# Patient Record
Sex: Female | Born: 1935 | Race: White | Hispanic: No | Smoking: Former smoker
Health system: Western US, Academic
[De-identification: ages and names within clinical notes are randomized; demographics above are authoritative.]

## PROBLEM LIST (undated history)

## (undated) DIAGNOSIS — I639 Cerebral infarction, unspecified: Secondary | ICD-10-CM

## (undated) HISTORY — DX: Cerebral infarction, unspecified: I63.9

---

## 2004-07-28 ENCOUNTER — Ambulatory Visit: Payer: Self-pay | Admitting: Family Medicine

## 2005-05-25 ENCOUNTER — Ambulatory Visit: Payer: Self-pay | Admitting: Rheumatology

## 2005-07-04 ENCOUNTER — Ambulatory Visit: Payer: Self-pay | Admitting: Rheumatology

## 2005-07-05 ENCOUNTER — Ambulatory Visit: Payer: Self-pay | Admitting: Obstetrics and Gynecology

## 2005-07-13 ENCOUNTER — Ambulatory Visit: Payer: Self-pay | Admitting: Obstetrics and Gynecology

## 2005-09-27 ENCOUNTER — Ambulatory Visit: Payer: Self-pay | Admitting: Family Medicine

## 2005-11-09 ENCOUNTER — Ambulatory Visit: Payer: Self-pay | Admitting: Family Medicine

## 2005-11-21 ENCOUNTER — Ambulatory Visit: Payer: Self-pay | Admitting: Family Medicine

## 2006-01-18 ENCOUNTER — Inpatient Hospital Stay: Payer: Self-pay | Admitting: Rheumatology

## 2006-03-22 ENCOUNTER — Ambulatory Visit: Payer: Self-pay | Admitting: Internal Medicine

## 2006-07-10 ENCOUNTER — Ambulatory Visit: Payer: Self-pay | Admitting: Obstetrics and Gynecology

## 2006-07-13 ENCOUNTER — Ambulatory Visit: Payer: Self-pay | Admitting: Specialist

## 2006-09-27 ENCOUNTER — Ambulatory Visit: Payer: Self-pay | Admitting: Gastroenterology

## 2006-10-21 IMAGING — CR DG CHEST 1V PORT
1 series · 1 of 1 positions shown · non-contrast
Comparison: none

REASON FOR EXAM: Post PICC insertion
COMMENTS:

[view not recorded]
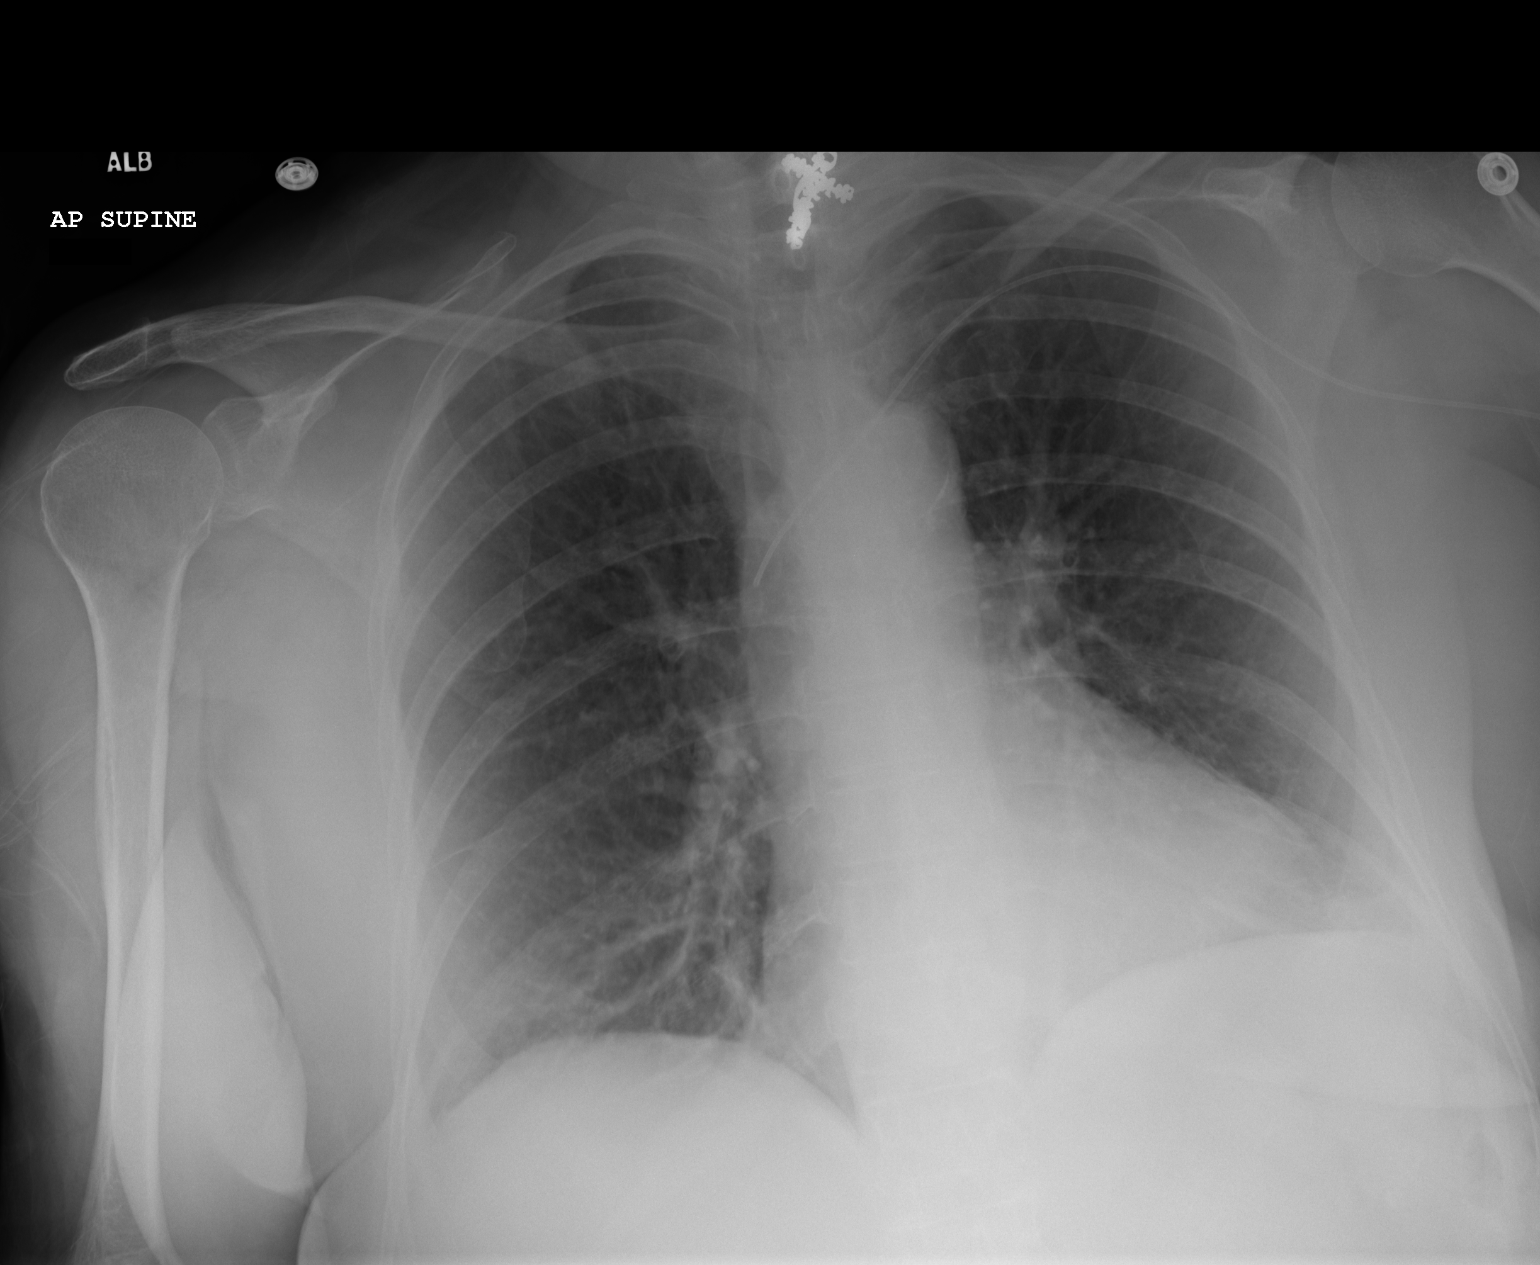

[1 of 1 positions shown; findings below may reference images not displayed]

PROCEDURE:     DXR - DXR PORTABLE CHEST SINGLE VIEW  - January 23, 2006  [DATE]

RESULT:        A LEFT sided central venous catheter is in place with lead
tip projecting in the region of the brachiocephalic/superior vena caval
junction.  There is no radiographic evidence of pneumothorax.  The cardiac
silhouette is enlarged.  The visualized bony skeleton appears grossly
unremarkable.
IMPRESSION: Central venous catheter as described above.

## 2006-12-26 ENCOUNTER — Ambulatory Visit: Payer: Self-pay | Admitting: Specialist

## 2007-02-12 ENCOUNTER — Emergency Department: Payer: Self-pay | Admitting: Emergency Medicine

## 2007-02-12 ENCOUNTER — Other Ambulatory Visit: Payer: Self-pay

## 2007-06-15 ENCOUNTER — Ambulatory Visit: Payer: Self-pay | Admitting: Internal Medicine

## 2007-06-28 ENCOUNTER — Ambulatory Visit: Payer: Self-pay | Admitting: Family Medicine

## 2007-07-16 ENCOUNTER — Ambulatory Visit: Payer: Self-pay | Admitting: Obstetrics and Gynecology

## 2007-12-04 ENCOUNTER — Other Ambulatory Visit: Payer: Self-pay

## 2007-12-04 ENCOUNTER — Inpatient Hospital Stay: Payer: Self-pay | Admitting: Specialist

## 2007-12-14 ENCOUNTER — Ambulatory Visit: Payer: Self-pay | Admitting: Family Medicine

## 2008-07-21 ENCOUNTER — Ambulatory Visit: Payer: Self-pay | Admitting: Internal Medicine

## 2008-07-31 ENCOUNTER — Ambulatory Visit: Payer: Self-pay | Admitting: Internal Medicine

## 2009-01-16 ENCOUNTER — Ambulatory Visit: Payer: Self-pay | Admitting: Internal Medicine

## 2009-07-30 ENCOUNTER — Ambulatory Visit: Payer: Self-pay | Admitting: Internal Medicine

## 2009-07-30 ENCOUNTER — Ambulatory Visit: Payer: Self-pay | Admitting: Specialist

## 2009-07-31 ENCOUNTER — Ambulatory Visit: Payer: Self-pay | Admitting: Internal Medicine

## 2009-08-04 ENCOUNTER — Ambulatory Visit: Payer: Self-pay | Admitting: Internal Medicine

## 2009-09-24 ENCOUNTER — Ambulatory Visit: Payer: Self-pay | Admitting: Gastroenterology

## 2010-01-19 ENCOUNTER — Ambulatory Visit: Payer: Self-pay | Admitting: Specialist

## 2010-06-28 ENCOUNTER — Ambulatory Visit: Payer: Self-pay | Admitting: Cardiology

## 2010-08-16 ENCOUNTER — Ambulatory Visit: Payer: Self-pay | Admitting: Internal Medicine

## 2010-10-15 ENCOUNTER — Ambulatory Visit: Payer: Self-pay | Admitting: Internal Medicine

## 2010-10-17 ENCOUNTER — Ambulatory Visit: Payer: Self-pay | Admitting: Family Medicine

## 2010-10-18 ENCOUNTER — Other Ambulatory Visit: Payer: Self-pay | Admitting: Rheumatology

## 2010-11-04 ENCOUNTER — Ambulatory Visit: Payer: Self-pay | Admitting: Obstetrics and Gynecology

## 2011-05-20 ENCOUNTER — Ambulatory Visit: Payer: Self-pay | Admitting: Internal Medicine

## 2011-05-26 ENCOUNTER — Ambulatory Visit: Payer: Self-pay | Admitting: Internal Medicine

## 2011-05-31 ENCOUNTER — Ambulatory Visit: Payer: Self-pay | Admitting: Internal Medicine

## 2011-07-26 ENCOUNTER — Ambulatory Visit: Payer: Self-pay | Admitting: Oncology

## 2011-11-08 ENCOUNTER — Ambulatory Visit: Payer: Self-pay | Admitting: Specialist

## 2012-02-21 IMAGING — CT CT ABD-PELV W/ CM
1 of 3 series · 13 of 32 positions shown, 18 images · non-contrast
Comparison: none

REASON FOR EXAM: Right lower extremitiy edema hx of vulvar ca eval for
lymphadenopathy  or a mass
COMMENTS:

[Series 2: soft tissue · axial · 0.74mm/px · z∈[-66,+284]mm · 13 of 80 slices shown, 18 images]
[im 5/80  soft-tissue]
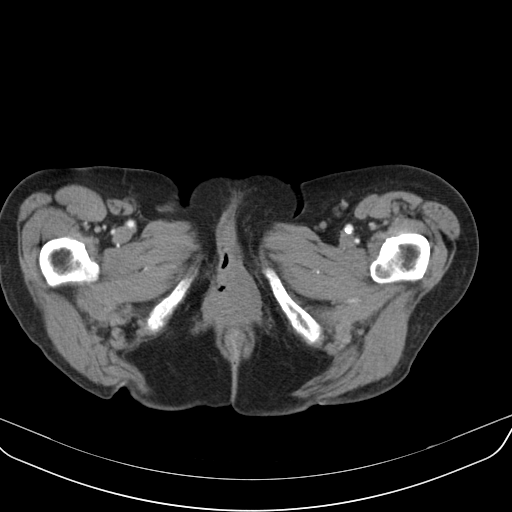
[im 5/80  bone]
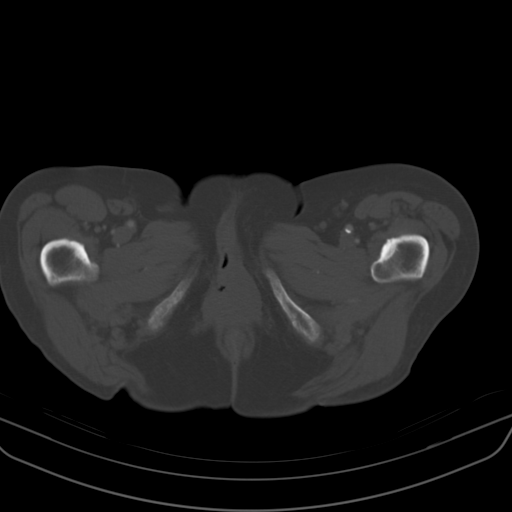
[im 14/80  soft-tissue]
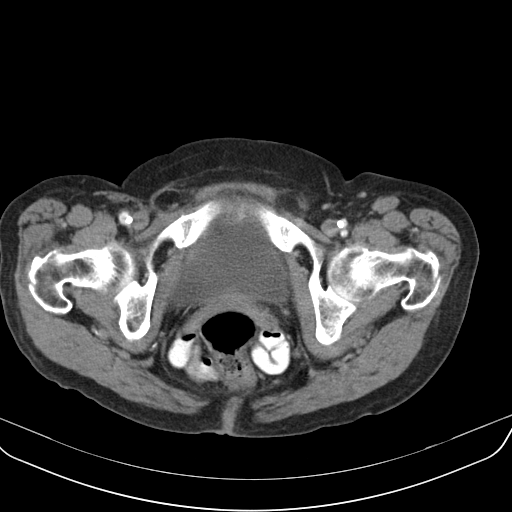
[im 19/80  soft-tissue]
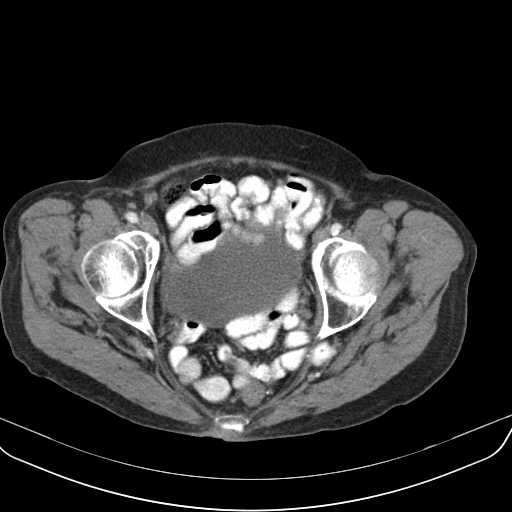
[im 24/80  soft-tissue]
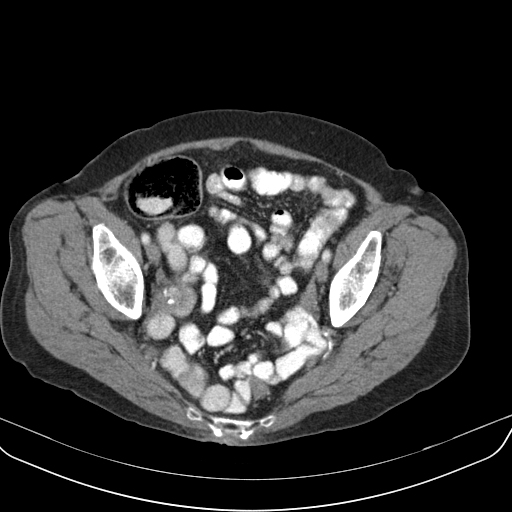
[im 33/80  soft-tissue]
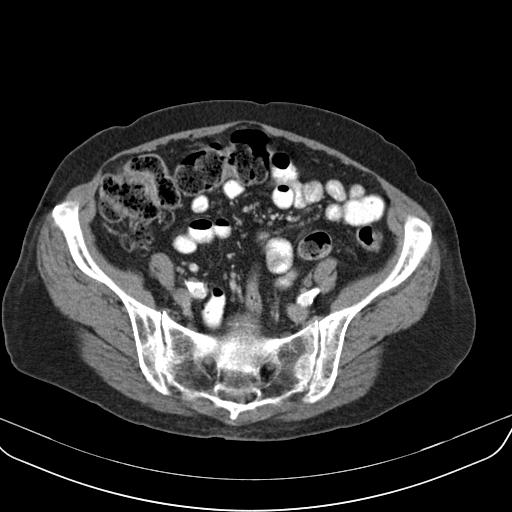
[im 38/80  soft-tissue]
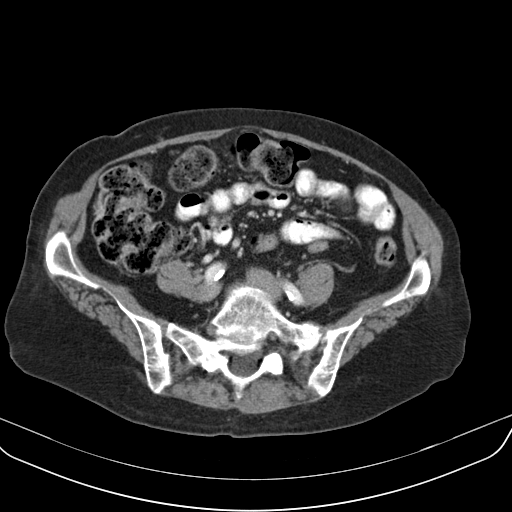
[im 42/80  soft-tissue]
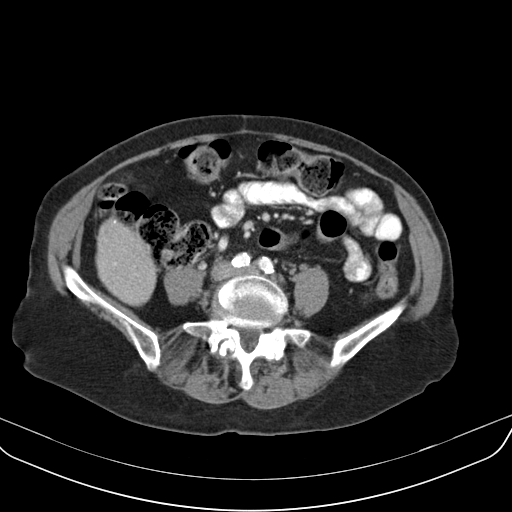
[im 52/80  soft-tissue]
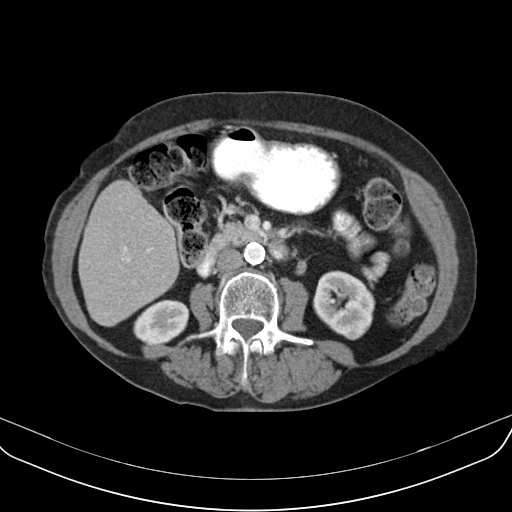
[im 56/80  soft-tissue]
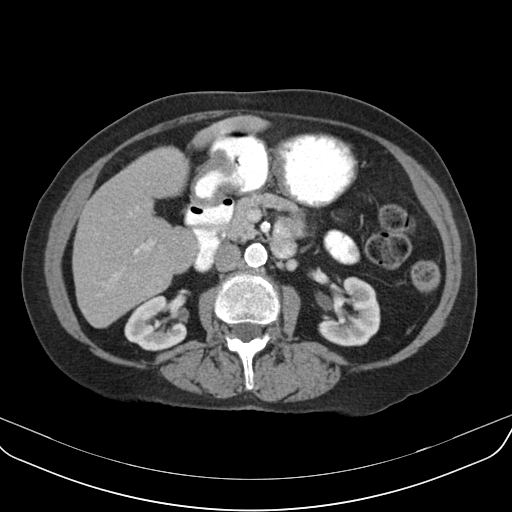
[im 56/80  bone]
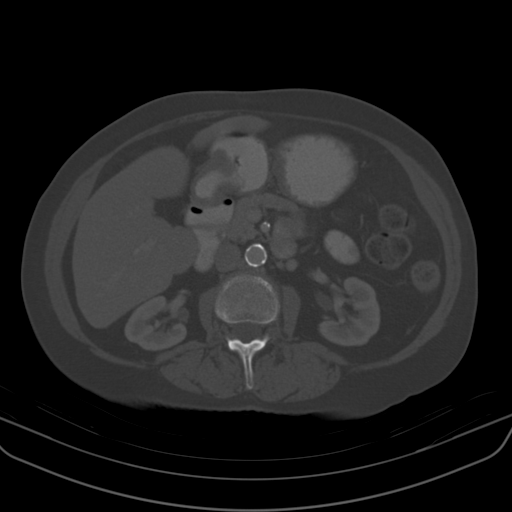
[im 61/80  soft-tissue]
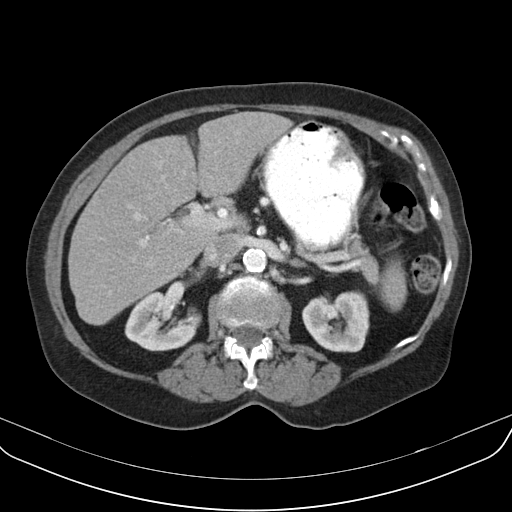
[im 61/80  lung]
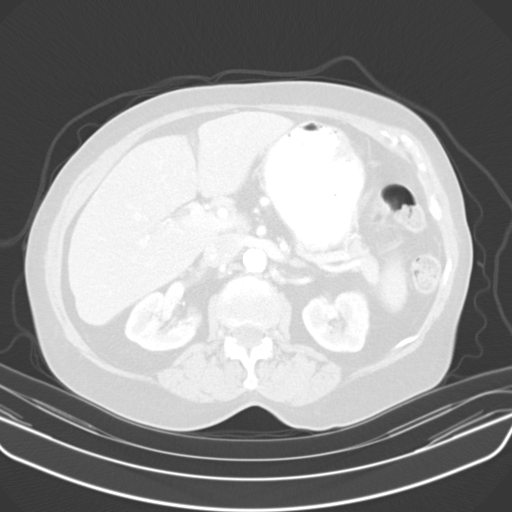
[im 66/80  lung]
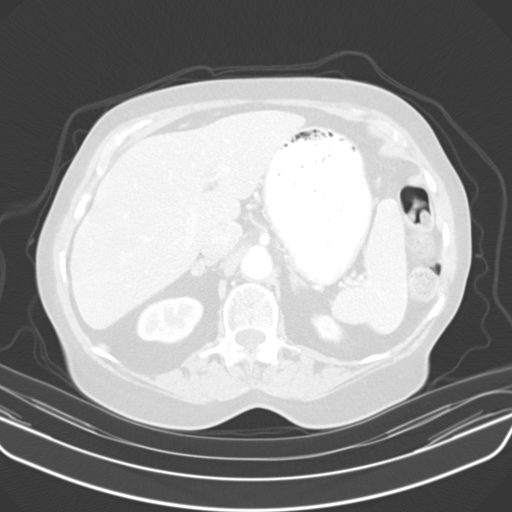
[im 70/80  soft-tissue]
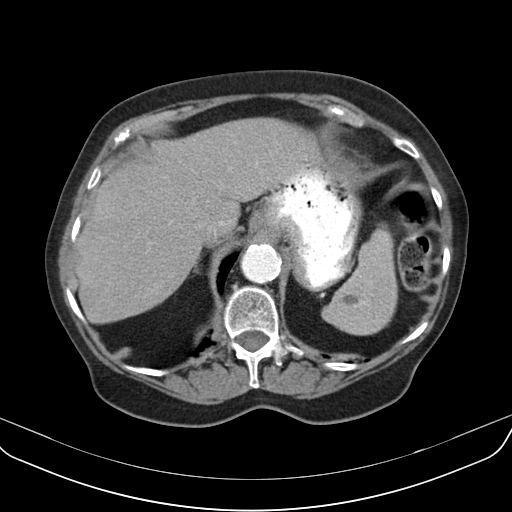
[im 70/80  lung]
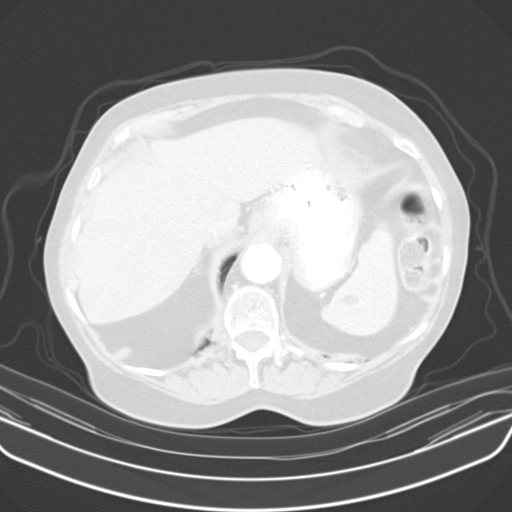
[im 75/80  soft-tissue]
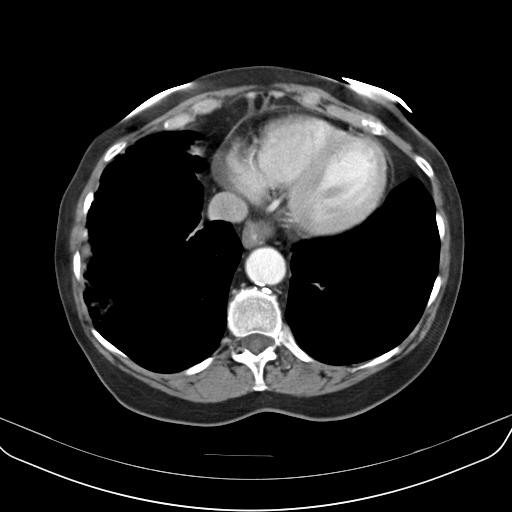
[im 75/80  lung]
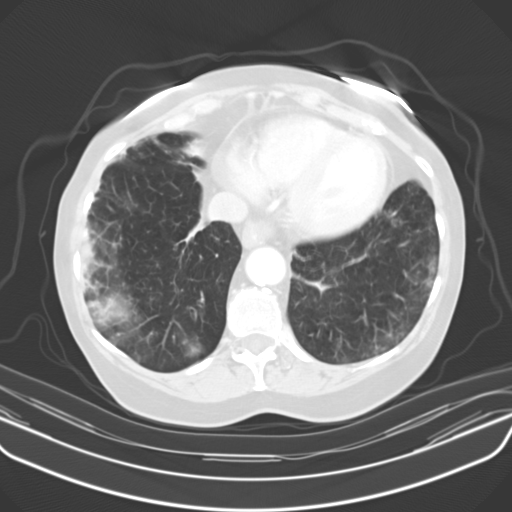

[13 of 32 positions shown; findings below may reference images not displayed]

PROCEDURE:     NITA - NITA ABDOMEN / PELVIS W  - May 26, 2011 [DATE]

RESULT:     Axial CT scanning was performed through the abdomen and pelvis
at 5 mm intervals and slice thicknesses following intravenous and
demonstration of 85 cc of Isovue 300. The patient also received oral
contrast material. Review of multiplanar reconstructed images was performed
separately on the VIA monitor.

The lung bases demonstrate minimal atelectatic versus fibrotic changes in
the lower lobes. The liver exhibits no focal mass nor ductal dilation. The
gallbladder is surgically absent. There is a moderate sized hiatal hernia.
The stomach is otherwise normal in appearance. The spleen is normal in size
and contains punctate calcifications consistent with prior granulomatous
infection. Hypodensities within the spleen likely reflect hemangiomas or
cysts. The pancreas exhibits no focal mass nor ductal dilation. There is
minimal prominence of the lateral limb of the right adrenal gland. The
kidneys enhance well with no evidence of obstruction. The caliber of the
abdominal aorta is normal.

The orally administered contrast has traversed much of the small bowel.
There is no evidence of small bowel obstruction or ileus. Contrast has not
yet reached colon. The stool and gas pattern within the colon is normal in
appearance. Within the pelvis the urinary bladder is partially distended and
grossly normal. There are structures in the adnexal regions which likely
reflect cysts. There are calcifications associated with both adnexal
structures. There is no free fluid within the pelvis.

The inferior vena cava and the common and external iliac veins appear normal
in caliber. No inguinal lymphadenopathy nor other mass effect upon the
venous structures is seen. There is a small right inguinal hernia containing
fat.

On delayed images contrast within the renal collecting system is normal in
appearance area the lumbar vertebral bodies are preserved in height.
IMPRESSION: 1. I do not see evidence of intra-abdominal nor pelvic nor inguinal
lymphadenopathy.
2. I see no hepatic masses. The gallbladder is surgically absent.
3. There are likely tiny cysts or hemangiomas within the spleen. There is a
minimal degree of enlargement of the right adrenal gland which is stable
since a study 19 January, 2010.
3. Within the adnexal regions normal sized complex-appearing structures are
present containing low density areas as well as calcifications. Further
evaluation with pelvic ultrasound may be useful.

## 2012-04-19 ENCOUNTER — Emergency Department: Payer: Self-pay | Admitting: Emergency Medicine

## 2012-04-20 ENCOUNTER — Observation Stay: Payer: Self-pay | Admitting: Internal Medicine

## 2012-04-20 ENCOUNTER — Ambulatory Visit: Payer: Self-pay | Admitting: Orthopaedic Surgery

## 2012-04-20 LAB — CBC
HCT: 37.7 % (ref 35.0–47.0)
MCH: 30.7 pg (ref 26.0–34.0)
MCHC: 34.4 g/dL (ref 32.0–36.0)
MCV: 89 fL (ref 80–100)
RBC: 4.22 10*6/uL (ref 3.80–5.20)
RDW: 14.8 % — ABNORMAL HIGH (ref 11.5–14.5)
WBC: 13.6 10*3/uL — ABNORMAL HIGH (ref 3.6–11.0)

## 2012-04-20 LAB — URINALYSIS, COMPLETE
Bacteria: NONE SEEN
Bilirubin,UR: NEGATIVE
Blood: NEGATIVE
Glucose,UR: NEGATIVE mg/dL (ref 0–75)
Leukocyte Esterase: NEGATIVE
Nitrite: NEGATIVE
Ph: 7 (ref 4.5–8.0)
Protein: NEGATIVE
Squamous Epithelial: NONE SEEN
WBC UR: 11 /HPF (ref 0–5)

## 2012-04-20 LAB — BASIC METABOLIC PANEL
Anion Gap: 10 (ref 7–16)
BUN: 22 mg/dL — ABNORMAL HIGH (ref 7–18)
Chloride: 96 mmol/L — ABNORMAL LOW (ref 98–107)
Creatinine: 1.11 mg/dL (ref 0.60–1.30)
EGFR (African American): 56 — ABNORMAL LOW
EGFR (Non-African Amer.): 49 — ABNORMAL LOW
Glucose: 83 mg/dL (ref 65–99)
Osmolality: 267 (ref 275–301)
Potassium: 3.5 mmol/L (ref 3.5–5.1)
Sodium: 132 mmol/L — ABNORMAL LOW (ref 136–145)

## 2012-04-21 LAB — BASIC METABOLIC PANEL
Chloride: 96 mmol/L — ABNORMAL LOW (ref 98–107)
Co2: 26 mmol/L (ref 21–32)
Creatinine: 0.84 mg/dL (ref 0.60–1.30)
EGFR (African American): >60
Glucose: 75 mg/dL (ref 65–99)
Potassium: 3.7 mmol/L (ref 3.5–5.1)
Sodium: 132 mmol/L — ABNORMAL LOW (ref 136–145)

## 2012-04-21 LAB — CBC WITH DIFFERENTIAL/PLATELET
Basophil %: 0.4 %
Eosinophil #: 0.2 10*3/uL (ref 0.0–0.7)
Eosinophil %: 2.3 %
HCT: 34.9 % — ABNORMAL LOW (ref 35.0–47.0)
HGB: 12.4 g/dL (ref 12.0–16.0)
Lymphocyte #: 1.3 10*3/uL (ref 1.0–3.6)
Lymphocyte %: 12.1 %
MCH: 31.4 pg (ref 26.0–34.0)
MCV: 88 fL (ref 80–100)
Monocyte #: 1 x10 3/mm — ABNORMAL HIGH (ref 0.2–0.9)
Monocyte %: 9.4 %
Neutrophil #: 8 10*3/uL — ABNORMAL HIGH (ref 1.4–6.5)
Neutrophil %: 75.8 %
RBC: 3.95 10*6/uL (ref 3.80–5.20)

## 2012-04-23 LAB — BASIC METABOLIC PANEL
Anion Gap: 8 (ref 7–16)
BUN: 18 mg/dL (ref 7–18)
Calcium, Total: 8.3 mg/dL — ABNORMAL LOW (ref 8.5–10.1)
Chloride: 104 mmol/L (ref 98–107)
EGFR (Non-African Amer.): >60
Glucose: 103 mg/dL — ABNORMAL HIGH (ref 65–99)
Potassium: 3.9 mmol/L (ref 3.5–5.1)
Sodium: 137 mmol/L (ref 136–145)

## 2012-06-19 ENCOUNTER — Ambulatory Visit: Payer: Self-pay | Admitting: Internal Medicine

## 2014-12-30 NOTE — Discharge Summary (Signed)
PATIENT NAME:  Maria FudgeHOWARD, Shantay A MR#:  [id number] DATE OF BIRTH:  1935-04-12  DATE OF ADMISSION:  04/20/2012 DATE OF DISCHARGE:  04/23/2012  ADDENDUM  PRIMARY CARE PHYSICIAN: Dr. Harrington Challengerhies  NOTE: Please see discharge summary by Dr. Cherlynn KaiserSainani on 04/21/2012. The reason why the patient was not discharged on 04/21/2012 was the patient was unable to urinate and was requiring straight catheters in order to urinate. I was able to stimulate the patient to urinate with urecholine and a fluid bolus and the patient was urinating well on the evening of 08/10 into 08/12. Patient was discharged home on 04/23/2012.   FINAL DIAGNOSES:  1. Status post fall and tibial plateau fracture.  2. Hypertension.  3. Hyperlipidemia.  4. Hypothyroidism.  5. Rheumatoid arthritis.  6. Urinary retention.  7. Constipation.  8. Chronic obstructive pulmonary disease with hypoxia.   MEDICATIONS ON DISCHARGE:  1. Levothyroxine 112 mcg daily. 2. Alendronate 70 mg weekly. 3. Diovan 160 mg daily.  4. Lansoprazole 30 mg daily.  5. Orencia 250 mg intravenous injection four vials once a month.  6. Verapamil 240 mg daily.  7. Ventolin HFA 2 puffs every six hours as needed for shortness of breath.  8. Atenolol 25 mg daily.  9. Atorvastatin 10 mg daily.  10. Refresh ophthalmic solution 1 to 2 drops daily.  11. Brimonidine 0.2 mg ophthalmic solution two drops right eye twice a day. 12. Vicodin 5/500, 1 tablet every four hours as needed for pain.  13. Spiriva 1 inhalation daily.  14. Symbicort 2 puffs twice a day. 15. Lovenox 40 mg subcutaneous injection once a day for two weeks.   REFERRAL: Home physical therapy.  OXYGEN: 1 liter nasal cannula.   DIET: Low sodium, low fat, low cholesterol diet.   ACTIVITY: Activity as tolerated, nonweightbearing left leg.   FOLLOW UP: Follow 1 to 2 weeks with Dr. Harrington Challengerhies. Follow up with Dr. Deeann SaintHoward Miller as scheduled orthopedics.  TIME SPENT ON DISCHARGE: 35  minutes.  ____________________________ Herschell Dimesichard J. Renae GlossWieting, MD rjw:cms D: 04/23/2012 15:59:44 ET T: 04/23/2012 17:16:16 ET JOB#: [id number]  cc: Herschell Dimesichard J. Renae GlossWieting, MD, <Dictator> Neomia Dearavid N. Harrington Challengerhies, MD Salley ScarletICHARD J Kiannah Grunow MD ELECTRONICALLY SIGNED 04/25/2012 15:25

## 2014-12-30 NOTE — H&P (Signed)
PATIENT NAME:  Maria Nash, Maria Nash MR#:  [id number] DATE OF BIRTH:  Aug 03, 1935  DATE OF ADMISSION:  04/20/2012  PRIMARY CARE PHYSICIAN: Dr Harrington Challenger.   REFERRING PHYSICIAN: Dr Darnelle Catalan.     CHIEF COMPLAINT: Fall and leg fracture yesterday.   HISTORY OF PRESENT ILLNESS: A 80 year old Caucasian female with a history of chronic obstructive pulmonary disease, rheumatoid arthritis, hypertension, hyperthyroidism, presented to the ED with fall and left leg fracture after the patient fell by accident yesterday, injured her left leg and injured her head and left leg, was diagnosed with left leg fracture and head laceration, was sent home. The patient got immobilization and sent home but the patient cannot walk and take care of herself so patient was sent to ED by her friends. Also surgeons say the patient recommended no indication for surgery but needs subacute rehabilitation so patient will be admitted to Medicine.   PAST MEDICAL HISTORY:  1. Chronic obstructive pulmonary disease.  2. RA.   3. Cervical cancer. 4. Acid reflux. 5. Hyperlipidemia. 6. Hypothyroidism 7. Hypertension. 8. History of TIA.  SOCIAL HISTORY: No smoking or drinking or illicit drugs. Living with her husband who is also a bedbound.    FAMILY HISTORY: Parents had coronary artery disease.   ALLERGIES: Penicillin, sulfa drugs.   MEDICATIONS:  1. Alendronate 70 mg p.o. once a week.  2. Diovan 80 mg p.o. daily.  3. Lansoprazole 30 mg p.o. daily.  4. Levothyroxine 112 mcg p.o. once daily.  5. Orencia 250 mg injection. 6. Zocor 20 mg p.o. at bedtime.  7. Tylenol No. 3 one tablet p.o. q.6h. p.r.n.  8. Verapamil 240 b.i.d.   REVIEW OF SYSTEMS: CONSTITUTIONAL: The patient denies any fever or chills. No headache or dizziness. No weakness. EYES: No double vision, blurred vision. ENT: No postnasal drip, epistaxis, slurred speech, or dysphagia. CARDIOVASCULAR: No chest pain, palpitation.  No orthopnea or nocturnal dyspnea. No leg edema.  PULMONARY: No cough, sputum, occasional shortness of breath, but no hemoptysis.  GASTROINTESTINAL: No abdominal pain, nausea, vomiting, or diarrhea, no melena, no bloody stool. GENITOURINARY: No dysuria, hematuria, or incontinence. HEMATOLOGY: No easy bruising or bleeding. SKIN: No rash or jaundice. ENDOCRINE: Positive for polyuria and urine frequency. NEUROLOGY: No syncope, loss of consciousness, or seizure. PSYCHIATRY: No depression, no anxiety.   PHYSICAL EXAMINATION:  VITALS: Temperature 99.3 and 99.7, pulse 92, respirations 20, O2 saturation 88% in room air, blood pressure 161/61.   GENERAL: The patient is alert, awake, oriented, in no acute distress.   HEENT: Pupils round, equal, react to light, accommodation. Moist oral mucosa. Clear oropharynx.   NECK: Supple. No JVD or carotid bruit. No lymphadenopathy. No thyromegaly.   CARDIOVASCULAR: S1, S2 regular rate and rhythm. No murmurs or gallops.   PULMONARY: Bilateral air entry. No wheezing or rales but has weak breath sounds.   ABDOMEN: Soft. No distention or tenderness. No organomegaly. Bowel sounds present.   EXTREMITIES: No edema, clubbing, or cyanosis. No calf tenderness. Left leg immobilization.     NEUROLOGY: Alert and oriented x3. No focal deficit, but the patient cannot move left leg.   SKIN: No rash or jaundice but has a laceration about 4 cm on the head which was treated with suture yesterday.   LABORATORY DATA: WBC 13.6, hemoglobin 13, platelets 195,000. Glucose 83, BUN 22, creatinine 1.11, sodium 132, potassium 3.5. Chest x-ray: Bilateral lung base segmental atelectasis versus fibrosis. Left knee x-ray shows proximal fracture in the fibula and the lateral left tibial plateau. Yesterday, the CAT  scan of head showed no evidence of acute abnormality.   IMPRESSION:  1. Fall.  2. Proximal fracture in fibula and tibia.   3. Leukocytosis and fever, need to rule out urinary tract infection.  4. Dehydration.  5. Hyponatremia.   6. Chronic obstructive pulmonary disease and hypoxia.  7. Hypertension, uncontrolled.  8. History of hypothyroidism, rheumatoid arthritis, acid reflux, and transient ischemic attack.   PLAN OF TREATMENT:  1. The patient will be placed for observation. Continue immobilization of left leg and get DVT prophylaxis.   2. We will get a urinalysis and followup CBC. Urinalysis was sent just now. We will start Rocephin, if no urinalysis may discontinue Rocephin. Leukocytosis is possibly due to urinary tract infection or reaction to fracture.  3. For dehydration and hyponatremia, we will start normal saline and follow up BMP.  4. Continue hypertension medication, Diovan and verapamil. 5. Patient needs case manager for arrangement of placement. Discussed patient's situation with the patient and the case manager.   TIME SPENT: About 55 minutes.    ____________________________ Maria PollackQing Shakirra Buehler, MD qc:vtd D: 04/20/2012 22:08:56 ET   T: 04/21/2012 05:43:06 ET   JOB#: [id number] cc: Maria PollackQing Naquisha Whitehair, MD, <Dictator>    Neomia Dearavid N. Harrington Challengerhies, MD Maria PollackQING Jara Feider MD ELECTRONICALLY SIGNED 04/23/2012 2:16

## 2014-12-30 NOTE — Consult Note (Signed)
Brief Consult Note: Diagnosis: Left proximal tibia/fibula fracture.   Patient was seen by consultant.   Recommend further assessment or treatment.   Discussed with Attending MD.   Comments: 80 yr old female with history of mechanical fall from a step ladder in her home yesterday.  Was evaluated in the ED at that time for scalp laceration and left knee pain/swelling.  Laceration was cleaned and closed, knee was placed in an immobilizer and patient elected to go home with planned followup with Dr. Hyacinth MeekerMiller on Monday.  Pateient returned to the ED tonight with inability to accomplish ADL's given pain and NWB restriction paired with her underlying mobility concerns related to RA.  PMhx - CVA, HTN, Thrombocytopenia, RA, COPD  Allergies - PCN, sulfa  Meds - see electronic record from this ED admission  Social - prior hx of tobacco, has quit.  lives with her disabled husband  Exam - LLE sensation and motor intact, warm and well perfused, tender over lateral joint line and proximal tibia, no laceration or abrasion.  no sign of compartment syndrome.  knee ROM limited by pain.  XR - small fragment of lateral plateau and associated proximal fibula fracture.  underlying advanced arthritic changes and joint space loss lateral > medial  Assesment and Plan lateral tibial plateau fracture closed treatment of tibial fracture with NWB LLE, knee immobilizer admit to medicine service for pain control and physical therapy instruction fu Dr. Hyacinth MeekerMiller approx 1 wk following discharge DVT ppx per medicine service.  Electronic Signatures: Otilio SaberSikes, Shereese Bonnie V (MD)  (Signed 09-Aug-13 21:27)  Authored: Brief Consult Note   Last Updated: 09-Aug-13 21:27 by Otilio SaberSikes, Jibril Mcminn V (MD)

## 2014-12-30 NOTE — Discharge Summary (Signed)
PATIENT NAME:  Maria FudgeHOWARD, Shizuye A MR#:  [id number] DATE OF BIRTH:  [birth year]  DATE OF ADMISSION:  04/20/2012 DATE OF DISCHARGE:  04/21/2012  For a detailed note, please take a look at the history and physical done by Dr. Imogene Burnhen.  DISCHARGE DIAGNOSES: 1. Status post fall and left tibial plateau and fibular fracture, nonoperable.  2. History of hypertension. 3. History of hyperlipidemia. 4. History of chronic obstructive pulmonary disease. 5. History of hypothyroidism. 6. History of rheumatoid arthritis.   DISPOSITION: The patient is being discharged home with home health nursing and physical therapy services.   ACTIVITY: As tolerated.  DISCHARGE FOLLOWUP: Followup is with Dr. Deeann SaintHoward Miller from orthopedics in the next 1 to 2 weeks, also followup with his primary care physician, Dr. Mickey Farberavid Thies, in the next 1 to 2 weeks.  DISCHARGE MEDICATIONS:  1. Synthroid 112 mcg daily.  2. Fosamax 70 mg weekly.  3. Diovan 160 mg daily.  4. Lansoprazole 30 mg daily.  5. Orencia 250 mg intravenous monthly. 6. Verapamil 240 mg twice a day. 7. Albuterol inhaler every 6 hours as needed. 8. Atenolol 25 mg daily.  9. Atorvastatin 10 mg daily.  10. Brimonidine 0.2% eye drops two drops twice a day. 11. Norco 5/500 mg one tab every four hours p.r.n.   PERTINENT STUDIES DURING HOSPITAL COURSE: CT scan of the head done without contrast showed no acute intracranial abnormality.   X-ray of the left knee showed fractures of lateral tibial plateau and proximal fibula.   Chest x-ray showed bilateral atelectasis.   BRIEF HOSPITAL COURSE: This is a 80 year old female with medical problems as mentioned above who presented to the hospital on 04/20/2012 after a fall and noted to have a left tibial plateau and fibular fracture.  1. Status post fall and left tibial plateau and fibular fracture: The patient was seen by orthopedics and they did not think that the fracture was operable. The patient was placed in an  immobilizer with a knee brace. The patient was started on IV morphine and p.r.n. Norco for pain control. A physical therapy consult was obtained. As per physical therapy, the patient is going to be discharged home with home health PT and nursing services with pain control as mentioned. The patient will have a follow-up with orthopedics, with Dr. Deeann SaintHoward Miller, within the next 1 to 2 weeks.  2. Chronic obstructive pulmonary disease: The patient had no evidence of acute exacerbation, although she was desaturating on minimal exertion and qualified for home oxygen which was arranged. She will continue her albuterol inhaler as stated.  3. Osteoporosis: The patient will continue her Orencia monthly injections along with Fosamax.  4. Hypothyroidism. The patient was maintained on Synthroid. She will resume that.  5. Hypertension: The patient will continue her maintenance medications including Diovan, verapamil, and atenolol as stated.  6. Glaucoma: The patient was maintained on her brimonidine eyedrops. She will resume that upon discharge.   CODE STATUS: THE PATIENT IS A FULL CODE.   TIME SPENT: 40 minutes.  ____________________________ Rolly PancakeVivek J. Cherlynn KaiserSainani, MD vjs:slb D: 04/21/2012 15:42:02 ET     T: 04/23/2012 11:09:39 ET        JOB#: [id number] cc: Valinda HoarHoward E. Miller, MD Neomia Dearavid N. Harrington Challengerhies, MD Houston SirenVIVEK J Glinda Natzke MD ELECTRONICALLY SIGNED 04/23/2012 13:52

## 2017-08-07 ENCOUNTER — Ambulatory Visit: Payer: Medicare Other | Attending: Internal Medicine | Admitting: Internal Medicine

## 2017-08-07 VITALS — BP 159/89 | HR 91 | Temp 98.4°F

## 2017-08-07 DIAGNOSIS — A310 Pulmonary mycobacterial infection: Principal | ICD-10-CM | POA: Insufficient documentation

## 2017-08-07 NOTE — Nursing Note (Signed)
Patient identified with 2 identifiers. Vitals, pain, allergies assessed.  Pharmacy verified.  Patient smokes no.   Patient needs influenza immunization no.  Edy Belt, LVN  OPAT Coordinator  Division of Infectious Disease  P: [phone]     F: [phone]

## 2017-08-08 ENCOUNTER — Encounter: Payer: Self-pay | Admitting: Internal Medicine

## 2017-08-08 DIAGNOSIS — A310 Pulmonary mycobacterial infection: Secondary | ICD-10-CM | POA: Insufficient documentation

## 2017-08-08 DIAGNOSIS — I10 Essential (primary) hypertension: Secondary | ICD-10-CM | POA: Insufficient documentation

## 2017-08-08 DIAGNOSIS — I639 Cerebral infarction, unspecified: Secondary | ICD-10-CM | POA: Insufficient documentation

## 2017-08-08 DIAGNOSIS — E785 Hyperlipidemia, unspecified: Secondary | ICD-10-CM | POA: Insufficient documentation

## 2017-08-08 DIAGNOSIS — J449 Chronic obstructive pulmonary disease, unspecified: Secondary | ICD-10-CM | POA: Insufficient documentation

## 2017-08-08 DIAGNOSIS — M069 Rheumatoid arthritis, unspecified: Secondary | ICD-10-CM | POA: Insufficient documentation

## 2017-08-08 DIAGNOSIS — K219 Gastro-esophageal reflux disease without esophagitis: Secondary | ICD-10-CM | POA: Insufficient documentation

## 2017-08-08 DIAGNOSIS — I509 Heart failure, unspecified: Secondary | ICD-10-CM | POA: Insufficient documentation

## 2017-08-08 DIAGNOSIS — E039 Hypothyroidism, unspecified: Secondary | ICD-10-CM | POA: Insufficient documentation

## 2017-08-08 DIAGNOSIS — N183 Chronic kidney disease, stage 3 unspecified: Secondary | ICD-10-CM | POA: Insufficient documentation

## 2017-08-08 HISTORY — DX: Cerebral infarction, unspecified: I63.9

## 2017-08-08 NOTE — Patient Instructions (Addendum)
MAC

## 2017-08-08 NOTE — Progress Notes (Signed)
INFECTIOUS DISEASES CLINIC NOTE    CC: Fibronodular pulmonary MAC    HPI:  Donna Doyle is an 82yrold female w/ fibronodular pulmonary MAC, COPD, and RA being seen for her pulmonary MAC. [facility]. She has had the dx of pulmonary MAC for < 1 yr now. Dx was made based on sxs, imaging, and cx results and pt was started on MWF azithromycin, mycobutin, and ethambutol which she took as directed w/ limited s/e initially until late summer 2018 when she developed acutely worsening visual acuity and color vision. She was being screened periodically by her eye doctor who then diagnosed her w/ optic neuritis and her ethambutol was stopped. Alternatives were discussed in early fall 2018 given the risk of resistance development on a 2 drug regimen. Given the limited clinical improvement she had had on her triple therapy it was unclear if treatment had been effective to date but given her ongoing need for immunosuppression it was similarly unclear if she could safely have her medications discontinued. Per in vitro testing of her MAC isolates the only alternative agent which would be effective per testing would be clofazimine. Decision was made at the time to pursue an IND for clofazimine use via [employer] and to f/u w/ rheum to reassess her pain (was not maximizing her tramadol use) and immunosuppressive regimens (majority of pt's RA sxs appeared to be from burned out rather than active dz). Since then the cl[facility]azimine IND was [facility] due to concerns about liability despite extensive risk/benefits discussions w/ the pt and the pt's acceptance of risk. Pt's tramadol regimen has also been adjusted w/ improvement in her RA sxs though they remain troublesome. She additionally remains on immunosuppressives. She continues to express interest in pursuing the IND for clofazimine  [facility] involvement w/ the [facility] process.    Since early fall pt reports she feels mostly the same. Continues to have SOB and DOE when she ambulates outside to the mailbox. Uses her rescue inhaler most mornings. Continues to have a chronic cough though it is now intermittently more productive. Denies any f/c/d, malaise, anorexia, weight loss, or significant constitutional sxs. RA is improved as noted. Her far vision is better somewhat, but her near vision remains impaired. Her f/u with her eye doctor is pending. ROS is negative for all other systems.      Medications:  Current Outpatient Medications   Medication Sig    ADVAIR HFA 115-21 mcg/actuation Inhaler Take 115 mcg by inhalation 2 times daily.    Amlodipine (NORVASC) 5 mg Tablet Take 5 mg by mouth every morning.    Atorvastatin (LIPITOR) 80 mg tablet Take 80 mg by mouth every day at bedtime.    Azithromycin (ZITHROMAX) 250 mg Tablet Take 500 mg by mouth every Monday, Wednesday and Friday.    Clopidogrel (PLAVIX) 75 mg Tablet Take 75 mg by mouth every morning.    ferrous sulfate 325 mg (65 mg iron) Tablet Take 325 mg by mouth 2 times daily.    Isosorbide Mononitrate (IMDUR) 30 mg SR Tablet Take 60 mg by mouth every morning.    Leflunomide (ARAVA) 10 mg Tablet Take 10 mg by mouth every day at bedtime.    Losartan (COZAAR) 100 mg tablet Take 50 mg by mouth every day  at bedtime.    Metoprolol Succinate (TOPROL-XL) 100 mg XL tablet Take 100 mg by mouth every morning.    MYCOBUTIN 150 mg Capsule Take 300 mg by mouth every Monday, Wednesday and Friday.    Omeprazole (PRILOSEC) 20 mg Delayed Release Capsule Take 20 mg by mouth every morning.    PredniSONE (DELTASONE) 5 mg Tablet Take 5 mg by mouth every day at bedtime.    SYNTHROID 125 mcg Tablet Take 125 mcg by mouth every morning.    ZETIA 10 mg Tablet Take 10 mg by mouth every morning.    Spironolactone 25 mg Take 25 mg by mouth every  morning    Spiriva 18 mcg  Inhale contents of one capsule daily    Albuterol 90 mcg Inhale 2 puffs every 4-6 hours as needed for SOB or wheezing    Tramadol 50 mg Take 50 mg by mouth three times a day as needed for pain          No current facility-administered medications for this visit.        Allergies:  Allergies   Allergen Reactions    Lisinopril Unknown-Explain in Comments     NA    Norco [Hydrocodone-Acetaminophen] Unknown-Explain in Comments     na    Penicillin Unknown-Explain in Comments     na    Sulfa (Sulfonamide Antibiotics) Unknown-Explain in Comments     na       Past Medical History:   Fibronodular pulmonary MAC  COPD  Rheumatoid arthritis (followed by Dr. Donovan Kail, rheumatology, at [facility])  Heart failure w/ preserved EF  HTN  HLP  CKD st III  Hypothyroidism  GERD  h/o CVA (08/2016)    No past surgical history on file.     Social History     Socioeconomic History    Marital status: Not on file     Spouse name: Not on file    Number of children: Not on file    Years of education: Not on file    Highest education level: Not on file   Social Needs    Financial resource strain: Not on file    Food insecurity - worry: Not on file    Food insecurity - inability: Not on file    Transportation needs - medical: Not on file    Transportation needs - non-medical: Not on file   Occupational History    Not on file   Tobacco Use    Smoking status: Former Smoker     Packs/day: 2.00     Years: 40.00     Pack years: 80.00     Types: Cigarettes    Smokeless tobacco: Never Used   Substance and Sexual Activity    Alcohol use: No     Frequency: Never    Drug use: No    Sexual activity: Not Currently   Other Topics Concern    Not on file   Social History Narrative    Not on file     I have revd past medical, surgical, family and social history. Any changes identified during this encounter were updated in the EMR.    Physical Exam:   BP 159/89 (SITE: left arm, Orthostatic Position:  sitting)   Pulse 91   Temp 36.9 C (98.4 F) (Temporal)   SpO2 95%   Gen: vitals reviewed; NAD, conversant  HEENT: PERRL, anicteric, no chemosis, MMM, no oral lesions  Heart: RRR, no murmurs  Lungs: CTAB,  decreased in bases, normal respiratory effort  Abd: normal BS, soft, nontender, nondistended  Ext: normal pedal pulse, no peripheral edema or digital cyanosis  MSK: chronic degenerative RA findings in b/l hands w/o significant active synovitis  Skin: no rash, ulcers, normal skin turgor  Neuro: grossly normal motor and sensory  Pysch: appropriate affect, AAOx3    Labs: I have reviewed these results this visit  No recent lab data available.     IMAGING:   None new.    MICRO:   Prior AFB cxs grew MAC from multiple cxs. Sensitive to azithro, rifampin, ethambutol, and clofazimine.                Assessment/Plan:    # Fibrocavitary Pulmonary MAC - Stable to slightly worse. Clinical assessment remains complicated, however, by her underlying COPD. She remains on the 2-drug regimen of azithro + rif which she is tolerating well. D/w her clofazimine IND w/ [employer] and it remains pending. We again discussed the risks and benefits of treatment and alternatives, and pt continues to express a desire to go forwarded w/ clofazimine treatment. Will notify pt and her caregiver when we have additional info. Will repeat baseline CBC and CMP prior to starting the new drug and will obtain a f/u Chest CT given it has been approx 6 mos since initiation of antimicrobial treatment. Will obtain repeat sputum AFB at [facility] given it is much closer to where she lives and her likely need to present to the lab multiple times w/ sputum samples. Total time spent counseling re: dx and tx was 30 mins.   - Continue MWF azithro + rif  - Clofazimine IND pending  - Repeat CBC and CMP prior to clofazimine initiation  - Repeat sputum AF[facility] at [facility]  - [facility]    Approximately 30 minutes were spent with  the patient, of which more than 50% was spent counseling and/or coordinating care on dx and tx. The patient understands and agrees with the plan of care as outlined.     The patient was advised to follow up once clofazimine obtained.           EDUCATION: I educated/instructed the patient or caregiver regarding all aspects of the above stated plan of care.  The patient or caregiver indicated understanding. [place] interpreter was not used.    Note Electronically Signed by [name]  ...................................  [name], MD  Assistant Professor  Division of Infectious Diseases  Pager: [phone]

## 2017-08-15 ENCOUNTER — Ambulatory Visit: Payer: Medicare Other

## 2017-08-15 DIAGNOSIS — Z006 Encounter for examination for normal comparison and control in clinical research program: Principal | ICD-10-CM

## 2017-08-15 NOTE — Progress Notes (Signed)
Research Note     The patient has met inclusion criteria and no exclusionary criteria and was consented for compassionate use of clofazimine. The study/informed consent was dicussed with the patientincluding:the purpose, procedures, andrisks/benefits in a private and quiet setting. She was given opportunity to consider consent and ask questions, all questions were answered and she voiced understanding. I confirmed that she did not feel pressured to make a decision, understands that her participation in this study is voluntary, and that she is capable of making and communicating an informed choice.      The patient signed consent on 04Dec18 at 0910. The patient's son, daughter-in-law and witness, Rogelio, were present during the consent process. The patient also reviewed and signed the HIPAA form. The patient's daughter in law printed her name on her behalf on both the consent and HIPAA forms as she was unable - this was witnessed by [facility] and was noted on the consent form with his signature. Signed copies of informed consent and HIPAA were given to her and were sent to HIM for upload into medical record.    The patient was informed that there was a delay in receiving the clofazimine and the study team would be in contact with them when this was resolved to begin treatment.     See research file for additional information.    Donna Doyle [phone]

## 2017-08-15 NOTE — Progress Notes (Signed)
Space only, no nursing services provided on this visit.

## 2017-09-25 ENCOUNTER — Other Ambulatory Visit: Payer: Self-pay | Admitting: Internal Medicine

## 2017-09-25 DIAGNOSIS — A310 Pulmonary mycobacterial infection: Principal | ICD-10-CM

## 2017-10-06 ENCOUNTER — Other Ambulatory Visit: Payer: Self-pay | Admitting: Internal Medicine

## 2017-10-06 DIAGNOSIS — A310 Pulmonary mycobacterial infection: Principal | ICD-10-CM

## 2017-10-06 MED ORDER — AZITHROMYCIN 250 MG TABLET
500.0000 mg | ORAL_TABLET | ORAL | 3 refills | Status: AC
Start: 2017-10-06 — End: 2018-01-04

## 2017-10-06 MED ORDER — MYCOBUTIN 150 MG CAPSULE
300.0000 mg | ORAL_CAPSULE | ORAL | 3 refills | Status: DC
Start: 2017-10-06 — End: 2018-03-28

## 2017-10-27 ENCOUNTER — Other Ambulatory Visit: Payer: Self-pay

## 2017-10-27 MED ORDER — STUDY DRUG - CLOFAZIMINE 50 MG CAPSULE  - CLOFAZIMINE (963340)
0 refills | Status: AC
Start: 2017-10-27 — End: ?
  Filled 2017-10-27: qty 200, 100d supply, fill #0

## 2017-11-06 ENCOUNTER — Encounter: Payer: Self-pay | Admitting: Internal Medicine

## 2017-11-20 ENCOUNTER — Ambulatory Visit: Payer: Medicare Other | Attending: Internal Medicine | Admitting: Internal Medicine

## 2017-11-20 ENCOUNTER — Ambulatory Visit: Payer: Medicare Other

## 2017-11-20 VITALS — BP 104/69 | HR 77

## 2017-11-20 DIAGNOSIS — A310 Pulmonary mycobacterial infection: Principal | ICD-10-CM

## 2017-11-20 DIAGNOSIS — J449 Chronic obstructive pulmonary disease, unspecified: Secondary | ICD-10-CM | POA: Insufficient documentation

## 2017-11-20 DIAGNOSIS — Z79899 Other long term (current) drug therapy: Secondary | ICD-10-CM | POA: Insufficient documentation

## 2017-11-20 LAB — CBC WITH DIFFERENTIAL
BASOPHILS % AUTO: 0.4 %
BASOPHILS ABS AUTO: 0 10*3/uL (ref 0.0–0.2)
EOSINOPHIL % AUTO: 1.3 %
EOSINOPHIL ABS AUTO: 0.1 10*3/uL (ref 0.0–0.5)
HEMATOCRIT: 41.2 % (ref 36.0–46.0)
HEMOGLOBIN: 13.8 g/dL (ref 12.0–16.0)
LYMPHOCYTE ABS AUTO: 1.2 10*3/uL (ref 1.0–4.8)
LYMPHOCYTES % AUTO: 13.4 %
MCH: 31.9 pg (ref 27.0–33.0)
MCHC: 33.5 % (ref 32.0–36.0)
MCV: 95.2 fL (ref 80.0–100.0)
MONOCYTES % AUTO: 9.5 %
MONOCYTES ABS AUTO: 0.9 10*3/uL — AB (ref 0.1–0.8)
MPV: 10 fL (ref 6.8–10.0)
NEUTROPHIL ABS AUTO: 6.8 10*3/uL (ref 1.8–7.7)
NEUTROPHILS % AUTO: 75.4 %
PLATELET COUNT: 191 10*3/uL (ref 130–400)
RDW: 13.8 % (ref 0.0–14.7)
RED CELL COUNT: 4.33 10*6/uL (ref 4.00–5.20)
WHITE BLOOD CELL COUNT: 9.1 10*3/uL (ref 4.5–11.0)

## 2017-11-20 LAB — COMPREHENSIVE METABOLIC PANEL
ALANINE TRANSFERASE (ALT): 22 U/L (ref 5–54)
ALKALINE PHOSPHATASE (ALP): 62 U/L (ref 35–115)
ASPARTATE TRANSAMINASE (AST): 28 U/L (ref 15–43)
Albumin: 3.7 g/dL (ref 3.2–4.6)
BILIRUBIN TOTAL: 1.1 mg/dL (ref 0.3–1.3)
CALCIUM: 9.2 mg/dL (ref 8.6–10.5)
CARBON DIOXIDE TOTAL: 19 mmol/L — AB (ref 24–32)
CHLORIDE: 106 mmol/L (ref 95–110)
Creatinine Serum: 1.16 mg/dL (ref 0.44–1.27)
E-GFR, AFRICAN AMERICAN: 51 mL/min/{1.73_m2}
E-GFR, NON-AFRICAN AMERICAN: 45 mL/min/{1.73_m2}
GLUCOSE: 99 mg/dL (ref 70–99)
POTASSIUM: 4.2 mmol/L (ref 3.3–5.0)
PROTEIN: 6.9 g/dL (ref 6.3–8.3)
SODIUM: 137 mmol/L (ref 135–145)
UREA NITROGEN, BLOOD (BUN): 23 mg/dL — AB (ref 8–22)

## 2017-11-20 NOTE — Patient Instructions (Signed)
Please go to lab to give blood and receive sputum collection cups for 3 sputum samples.    Please schedule follow up Chest CT scan to reevaluate your lungs.

## 2017-11-20 NOTE — Nursing Note (Signed)
Patient identified with 2 identifiers. Vitals, pain, allergies assessed.  Pharmacy verified.  Patient smokes no.   Patient needs influenza immunization no.  Sheryl Towell, LVN  OPAT Coordinator  Division of Infectious Disease  P: [phone]     F: [phone]

## 2017-11-20 NOTE — Progress Notes (Signed)
INFECTIOUS DISEASES CLINIC NOTE    CC: F/u fibronodular pulmonary MAC    Background:  Donna Doyle is an 83yrold female w/ fibronodular pulmonary MAC, COPD, and RA being seen for her pulmonary MAC. [facility]. She was dx w/ pulmonary MAC in earl to mid-2018. Dx was made based on sxs, imaging, and cx results and pt was started on MWF azithromycin, mycobutin, and ethambutol which she took as directed w/ limited s/e initially until late summer 2018 when she developed acutely worsening visual acuity and color vision. She was being screened periodically by her eye doctor who then diagnosed her w/ optic neuritis and her ethambutol was stopped. Alternatives were discussed in early fall 2018 given the risk of resistance development on a 2 drug regimen. Given the limited clinical improvement she had had on her triple therapy it was unclear if treatment had been effective to date but given her ongoing need for immunosuppression it was similarly unclear if she could safely have her medications discontinued. Per in vitro testing of her MAC isolates the only alternative agent which would be effective per testing would be clofazimine. Decision was made at the time to pursue an IND for clofazimine use via [employer] and to f/u w/ rheum to reassess her pain (was not maximizing her tramadol use) and immunosuppressive regimens (majority of pt's RA sxs appeared to be from burned out rather than active dz). The initial clofazimine IND was rejected [facility] department due to concerns about liability despite extensive risk/benefits discussions w/ the pt and the pt's acceptance of risk. Pt's care was then transferred here for utilization of our IND in November 2018.     Since last visit patient reports overall feeling better. Continues to have SOB and DOE but these are now intermittent and less limiting Uses her rescue  inhaler only periodically. Cough is also now intermittent though still occasionally productive of a clear sputum which she feels comes from deep in her chest. Denies any f/c/d, malaise, anorexia, weight loss, or significant constitutional sxs. RA has been a little worse as of late. No change in her immunosuppression or pain meds. Vision is also unchanged though per her ophthalmologist a significant portion of her visual sxs may be sequelae from her CVA earlier in 2018. ROS is negative for all other systems. No s/e with the clofazimine (has been on for 3 wks) or MWF azithro + rifabutin. No other new meds. Pt has not provided any recent sputum samples or had any f/u imaging. [facility] but we do not have these records. Per pt's daughter they were stable for her.     Medications:    Current Outpatient Medications on File Prior to Visit:     ADVAIR HFA 115-21 mcg/actuation Inhaler    Amlodipine (NORVASC) 5 mg Tablet    Atorvastatin (LIPITOR) 80 mg tablet    Azithromycin (ZITHROMAX) 250 mg Tablet    Clopidogrel (PLAVIX) 75 mg Tablet    ferrous sulfate 325 mg (65 mg iron) Tablet    Isosorbide Mononitrate (IMDUR) 30 mg SR Tablet    Leflunomide (ARAVA) 10 mg Tablet    Losartan (COZAAR) 100 mg tablet    Metoprolol Succinate (TOPROL-XL) 100 mg XL tablet    MYCOBUTIN 150 mg Capsule    Omeprazole (PRILOSEC) 20 mg Delayed Release Capsule    PredniSONE (  DELTASONE) 5 mg Tablet    Study Drug - Clofazimine (IRB [id number]) 50 mg Capsule    SYNTHROID 125 mcg Tablet    ZETIA 10 mg Tablet    Allergies:  Allergies   Allergen Reactions    Lisinopril Unknown-Explain in Comments     NA    Norco [Hydrocodone-Acetaminophen] Unknown-Explain in Comments     na    Penicillin Unknown-Explain in Comments     na    Sulfa (Sulfonamide Antibiotics) Unknown-Explain in Comments     na       Past Medical History: no interval changes  Fibronodular pulmonary  MAC  COPD  Rheumatoid arthritis (followed by Dr. Donovan Kail, rheumatology, at [facility])  Heart failure w/ preserved EF  HTN  HLP  CKD st III  Hypothyroidism  GERD  h/o CVA (08/2016)  Glaucoma    No past surgical history on file.     Social History     Socioeconomic History    Marital status: Not on file     Spouse name: Not on file    Number of children: Not on file    Years of education: Not on file    Highest education level: Not on file   Social Needs    Financial resource strain: Not on file    Food insecurity - worry: Not on file    Food insecurity - inability: Not on file    Transportation needs - medical: Not on file    Transportation needs - non-medical: Not on file   Occupational History    Not on file   Tobacco Use    Smoking status: Former Smoker     Packs/day: 2.00     Years: 40.00     Pack years: 80.00     Types: Cigarettes    Smokeless tobacco: Never Used   Substance and Sexual Activity    Alcohol use: No     Frequency: Never    Drug use: No    Sexual activity: Not Currently   Other Topics Concern    Not on file   Social History Narrative    Not on file     I have revd past medical, surgical, family and social history. Any changes identified during this encounter were updated in the EMR.    Physical Exam:   BP 104/69 (SITE: right arm, Orthostatic Position: sitting, Cuff Size: regular)   Pulse 77   SpO2 94%   Gen: vitals reviewed; NAD, conversant  HEENT: PERRL, anicteric, no chemosis, MMM, no oral lesions  Heart: RRR, no murmurs  Lungs: CTAB, decreased in bases, normal respiratory effort  Abd: normal BS, soft, nontender, nondistended  Ext: normal pedal pulse, no peripheral edema or digital cyanosis  MSK: chronic degenerative RA findings in b/l hands w/o significant active synovitis  Skin: no rash, ulcers, skin discoloration, normal skin turgor  Neuro: grossly normal motor and sensory  Pysch: appropriate affect, AAOx3    Labs: I have reviewed these results this visit  No  recent lab data available.     IMAGING:   None new.    MICRO:   Prior AFB cxs grew MAC from multiple cxs. Sensitive to azithro, rifampin, ethambutol, and clofazimine.                Assessment/Plan:    # Fibrocavitary Pulmonary MAC - Improved. Decreased SOB, DOE, and cough. Clinical assessment remains complicated, as always, by her underlying COPD though her treatment for this has  not changed recently. She is tolerating her clofazimine well and overall doing great on the 3-drug regimen w/ azithro + rifabutin. Will repeat CBC and CMP now and will obtain a f/u Chest CT. Will obtain repeat sputum AFB here, but if able pt may also have labs ordered at [facility] given it is much closer to where she lives. Would need to forward copies of these results and her prior imaging to [place] to aid our evaluation. Will see again in 3 mos when all the above complete.  - Continue MWF azithro + rif  - Continue Clofazimine daily  - Repeat CBC and CMP   - Repeat sputum AFB   - Repeat CT Chest  - f/u 3 mos         EDUCATION: I educated/instructed the patient or caregiver regarding all aspects of the above stated plan of care.  The patient or caregiver indicated understanding. [place] interpreter was not used.    Note Electronically Signed by [name]  ...................................  [name], MD  Assistant Professor  Division of Infectious Diseases  Pager: [phone]

## 2017-12-04 ENCOUNTER — Ambulatory Visit
Admission: RE | Admit: 2017-12-04 | Discharge: 2017-12-04 | Disposition: A | Discharge status: Home / Self Care | Payer: Medicare Other | Source: Ambulatory Visit | Attending: Internal Medicine | Admitting: Internal Medicine

## 2017-12-04 DIAGNOSIS — A310 Pulmonary mycobacterial infection: Principal | ICD-10-CM | POA: Insufficient documentation

## 2017-12-04 MED ORDER — IOHEXOL 350 MG IODINE/ML INTRAVENOUS SOLUTION
100.0000 mL | INTRAVENOUS | Status: AC
Start: 2017-12-04 — End: 2017-12-04
  Administered 2017-12-04: 100 mL via INTRAVENOUS

## 2018-01-10 ENCOUNTER — Other Ambulatory Visit: Payer: Self-pay

## 2018-01-25 ENCOUNTER — Other Ambulatory Visit: Payer: Self-pay

## 2018-01-29 ENCOUNTER — Other Ambulatory Visit: Payer: Self-pay

## 2018-01-29 MED ORDER — STUDY DRUG - CLOFAZIMINE 50 MG CAPSULE  - CLOFAZIMINE (963340)
0 refills | Status: AC
Start: 2018-01-29 — End: ?
  Filled 2018-01-29: qty 200, 100d supply, fill #0

## 2018-01-30 ENCOUNTER — Other Ambulatory Visit: Payer: Self-pay

## 2018-03-14 ENCOUNTER — Ambulatory Visit: Payer: Medicare Other | Admitting: Internal Medicine

## 2018-03-28 ENCOUNTER — Ambulatory Visit: Payer: Medicare Other | Attending: Internal Medicine | Admitting: Internal Medicine

## 2018-03-28 ENCOUNTER — Encounter: Payer: Self-pay | Admitting: Internal Medicine

## 2018-03-28 DIAGNOSIS — Z792 Long term (current) use of antibiotics: Secondary | ICD-10-CM | POA: Insufficient documentation

## 2018-03-28 DIAGNOSIS — A310 Pulmonary mycobacterial infection: Secondary | ICD-10-CM | POA: Insufficient documentation

## 2018-03-28 MED ORDER — AZITHROMYCIN 500 MG TABLET
500.0000 mg | ORAL_TABLET | ORAL | 0 refills | Status: AC
Start: 2018-03-28 — End: 2018-06-26

## 2018-03-28 MED ORDER — MYCOBUTIN 150 MG CAPSULE
300.0000 mg | ORAL_CAPSULE | ORAL | 3 refills | Status: DC
Start: 2018-03-28 — End: 2018-03-30

## 2018-03-28 NOTE — Progress Notes (Addendum)
Internal Medicine Clinic Note    Name: Donna Doyle  MRN: [id number]      Assessment/Plan: Donna Doyle is an 83 y/o F with PMH of COPD, RA, and pulmonary MAC on azithromycin, mycobutin, and clofazamine who is here for a 78-monthfollow-up.    # Pulmonary Mycobacterium avium complex (MAC) infection  Initially diagnosed in 2018. Patient was previously on ethambutol but was discontinued secondary to development of optic neuritis. She has been on azithromycin, mycobutin, and clofazamine since last visit and notes improvement. No need to repeat CT Chest at this visit since it was done 3 months ago and showed stable disease. Patient recently had labs done with her rheumatologist and we asked her to send uKoreathose results, primarily her LFTs. We will also need to re-check her sputum culture to help determine length of antibiotic regimen.  - Continue clofazamine  - Refilled azithromycin and mycobutin  - Sputum AFB culture x 3  - Asked patient to send uKorearecent labwork  - F/u in 3 monhs    Health Maintenance   Topic Date Due    DTAP/TDAP/TD (1 - Tdap) 02/14/1956    Shingrix (Zoster) (1 of 2) 02/14/1987    Pneumococcal vaccine (1 of 2 - PCV13) 02/13/2002    DEXA SCAN  02/13/2002    INFLUENZA  04/12/2018     Follow up: 3 months in ID clinic    Electronically signed by:  Donna Doyle  Neurology, PGY-1  [facility] P[facility] [phone]   03/28/2018      ID Attending Addendum:    I saw and evaluated the patient with Dr. AGriffin Doyle  I repeated and reviewed pertinent history and physical examination.  Together we developed the assessment and management plan above.  I agree with his findings as stated above. Patient doing well on treatment w/ no significant s/e. Awaiting repeat lab results obtained by her rheumatologist. Recent imaging performed approx 3 mos ago so no need to repeat currently. Awaiting additional sputum AFB cx results to  guide duration of treatment as typically, ideally, we would like to treat for 1 yr from cx clearance. Pt and daugther again state [facility] [place] which is fine if they can provide uKoreathe results. Will f/u w/ their pulmonologist for orders.     SDavonna Belling MD  Assistant Professor   Division of Infectious Disease  (p): [phone] (o): [phone]             EHilde Churchmanis a 89yrld adult in clinic today for evaluation.    CC: 3-23-monthllow-up for MAC    HPI:  Donna Doyle an [age]o F with PMH of COPD, RA, and pulmonary MAC on azithromycin, mycobutin, and clofazamine who is here for a 3-m36-monthlow-up. She says that she feels like she has been having more coughing fits than prior; producing some clear mucus. Per her daughter in law, who was also in the room, she thinks that the coughing has improved but believes that pt is swallowing more air while she is eating which might be contributing to her coughing. The daughter in law also believes that the clofazamine has helped with the patient's respiratory symptoms. Pt denies changes in pigmentation, which is a potential risk with clofazamine. She does note that she is having difficulty breathing more at night over the past few months; sleeps with 3  pillows. She has been sleeping poorly and daughter in law believes that it might be secondary to the fact that she sleeps too much during the day; she also snores. She has been waking up out of breath for the past 3 months; daughter in law was not aware of this. She acknowledges dyspnea on exertion. No changes in medications since last visit other than 2 infusions of Remicade for her RA. Her vision is unchanged since last visit.    ROS:  See HPI above for pertinent positives/negatives.  General: denies fever, chills, night sweats, skin discoloration; admits to fatigue  Cardiac:  denies chest pain, palpitations  Pulmonary: denies sore throat; admits to dyspnea on exertion  GI: denies abdominal pain, nausea, vomiting, constipation, diarrhea  GU: admits to increased urinary frequency at night    Allergies:    Lisinopril    Unknown-Explain in Comments    Comment:NA  Norco [Hydrocodone-Acetaminophen]    Unknown-Explain in Comments    Comment:na  Penicillin    Unknown-Explain in Comments    Comment:na  Sulfa (Sulfonamide Antibiotics)    Unknown-Explain in Comments    Comment:na    Medications:   Outpatient Medications Marked as Taking for the 03/28/18 encounter (Office Visit) with Donna Doyle.   Medication Sig Dispense Refill    ADVAIR HFA 115-21 mcg/actuation Inhaler Take 115 mcg by inhalation 2 times daily.      Amlodipine (NORVASC) 5 mg Tablet Take 5 mg by mouth every morning.      Atorvastatin (LIPITOR) 80 mg tablet Take 80 mg by mouth every day at bedtime.      azithromycin (ZITHROMAX) 500 mg tablet Take 1 tablet by mouth every Monday, Wednesday and Friday. Take it on MWF 36 tablet 0    Clopidogrel (PLAVIX) 75 mg Tablet Take 75 mg by mouth every morning.      ferrous sulfate 325 mg (65 mg iron) Tablet Take 325 mg by mouth 2 times daily.      Isosorbide Mononitrate (IMDUR) 30 mg SR Tablet Take 60 mg by mouth every morning.      Leflunomide (ARAVA) 10 mg Tablet Take 10 mg by mouth every day at bedtime.      Losartan (COZAAR) 100 mg tablet Take 50 mg by mouth every day at bedtime.      Metoprolol Succinate (TOPROL-XL) 100 mg XL tablet Take 100 mg by mouth every morning.      MYCOBUTIN 150 mg Capsule Take 2 capsules by mouth every Monday, Wednesday and Friday. 72 capsule 3    Omeprazole (PRILOSEC) 20 mg Delayed Release Capsule Take 20 mg by mouth every morning.      PredniSONE (DELTASONE) 5 mg Tablet Take 5 mg by mouth every day at bedtime.      Study Drug - Clofazimine (IRB [phone]) 50 mg Capsule Take 2 capsules (100 mg) by mouth once daily at approximately the same  time each day, WITH food. Save all bottles and extra capsules. 200 capsule 0    Study Drug - Clofazimine (IRB [phone]) 50 mg Capsule Take 2 capsules (100 mg) by mouth once daily at approximately the same time each day, WITH food. Save all bottles and extra capsules. 200 capsule 0    SYNTHROID 125 mcg Tablet Take 125 mcg by mouth every morning.      ZETIA 10 mg Tablet Take 10 mg by mouth every morning.         History:  I did review patient's past medical and family/social  history, no changes noted.     Physical Exam:  BP 136/64 (SITE: left arm, Orthostatic Position: sitting, Cuff Size: regular)   Pulse 65   Temp 36.8 C (98.3 F) (Temporal)   Ht 1.524 m (5')   Wt 47.2 kg (104 lb 0.9 oz)   SpO2 95%   BMI 20.32 kg/m       03/28/18  0807   PainSc: No pain     General Appearance: healthy, alert, no distress, pleasant affect, cooperative.  Heart:  normal rate and regular rhythm, no murmurs, clicks, or gallops.  Lungs: clear to auscultation bilaterally; no increased work of breathing or cyanosis.  Extremities:  1+ pedal edema in RLE; more so than LLE    Labs/Tests/Imaging:   I reviewed the following laboratory or imaging/study reports, including:   CBC/CMP March 2019 which were WNL.     I personally reviewed the following images/tracings:  CT Chest March 2019: Residual bronchiectasis within the right middle and right lower lobes and mild mucus plugging with mild peripheral areas of fibrosis more so in the lower lobes, suggesting prior inflammation.    Assessment/Plan:  Assessment and plan has been moved to the top of the note for clarity and ease of reading.    EDUCATION:  I educated/instructed the patient or caregiver regarding all aspects of the above stated plan of care.  The patient or caregiver indicated understanding.      [place] interpreter was not used.

## 2018-03-28 NOTE — Patient Instructions (Addendum)
-   We will need to repeat your sputum culture to see if it is still growing the Mycobacterium avium bacteria, since that is one of the endpoints to stopping therapy  - Continue your current antibiotics  - Please send the results from your most recent bloodwork to our clinic; you can text this to Dr. Michail Jewels  - Follow-up appointment in 3 months

## 2018-03-28 NOTE — Nursing Note (Signed)
Vital signs taken, allergies verified, screened for pain.   Kaylany Tesoriero, LVN

## 2018-03-29 NOTE — Addendum Note (Signed)
Addended by: Maude Leriche on: 03/29/2018 09:30 AM     Modules accepted: Level of Service

## 2018-03-30 ENCOUNTER — Telehealth: Payer: Self-pay | Admitting: Internal Medicine

## 2018-03-30 DIAGNOSIS — A310 Pulmonary mycobacterial infection: Principal | ICD-10-CM

## 2018-03-30 MED ORDER — RIFABUTIN 150 MG CAPSULE
ORAL_CAPSULE | ORAL | 3 refills | Status: AC
Start: 2018-03-30 — End: 2018-07-12

## 2018-03-30 NOTE — Telephone Encounter (Signed)
Can you send a new prescription for rifabutin generic?

## 2018-03-30 NOTE — Telephone Encounter (Signed)
New order placed

## 2018-03-30 NOTE — Telephone Encounter (Signed)
Provider: Michail Jewels, Scott/ ID     Mycobutin brand is not covered by the patient's insurance.      The following medications are formulary alternatives:  Rifabutin generic     If formulary medications can be ruled out, please respond with explanation.    Please contact the Refill Clinic if you have questions regarding formulary alternatives or for clarification.    Venetia Night, Pharm Tech

## 2018-03-30 NOTE — Telephone Encounter (Signed)
Confirmed with Trinda Pascal at [facility] order new generic prescription received and is currently in processed but will need to order medication.  Luckily Trinda Pascal reports pharmacy last refilled medication on 01/16/18 as a 90 day supply.    Thank you  Venetia Night  Pharmacy Technician III   Internal Med/Specialty Refill Clinic  Phone: [phone]  Fax: [phone]

## 2018-05-07 ENCOUNTER — Other Ambulatory Visit: Payer: Self-pay

## 2018-05-07 MED ORDER — STUDY DRUG - CLOFAZIMINE 50 MG CAPSULE  - CLOFAZIMINE (963340)
0 refills | Status: AC
Start: 2018-05-07 — End: ?
  Filled 2018-05-07: qty 200, 100d supply, fill #0

## 2018-05-09 ENCOUNTER — Other Ambulatory Visit: Payer: Self-pay

## 2018-05-15 ENCOUNTER — Other Ambulatory Visit: Payer: Self-pay

## 2018-07-18 ENCOUNTER — Ambulatory Visit: Payer: Medicare Other | Admitting: Internal Medicine

## 2018-08-28 ENCOUNTER — Other Ambulatory Visit: Payer: Self-pay

## 2018-08-30 ENCOUNTER — Other Ambulatory Visit: Payer: Self-pay

## 2018-08-30 MED ORDER — STUDY DRUG - CLOFAZIMINE 50 MG CAPSULE  - CLOFAZIMINE (963340)
0 refills | Status: AC
Start: 2018-08-30 — End: ?
  Filled 2018-08-30: qty 200, 200d supply, fill #0

## 2018-08-31 ENCOUNTER — Other Ambulatory Visit: Payer: Self-pay

## 2018-11-21 ENCOUNTER — Ambulatory Visit: Payer: Medicare Other | Admitting: Internal Medicine

## 2019-01-02 ENCOUNTER — Ambulatory Visit: Payer: Medicare Other | Admitting: Internal Medicine

## 2019-01-02 DIAGNOSIS — A310 Pulmonary mycobacterial infection: Secondary | ICD-10-CM

## 2019-01-02 NOTE — Progress Notes (Addendum)
INFECTIOUS DISEASES TELEPHONE NOTE    HPI: Donna Doyle is a 84yrold woman with past medical history of COPD, RA, and pulmonary MAC (diagnosed 2018), who has a telephone visit today for follow up.    She has been on azithromycin, rifabutin, and clofazamine and has tolerated this regimen well. Of note, she had previously been on ethambutol but it was discontinued after she developed optic neuritis in summer 2018. As in vitro testing of her MAC isolates had shown that the only alternative effective agent would be clofazamine, she was eventually switched to that in February 2019.     Last seen 03/28/18 in ID clinic. CT from 3 months prior was stable.  [facility] Repeat AFB cultures x 3 were ordered to assess for sputum clearance and guiding duration of therapy.     Today spoke by phone with patient and caretaker/daughter-in-law.   She continues to take azithromycin and rifabutin, tolerating well.  She ran out of clofazamine about 1-1.5 weeks ago, daughter-in-law has called to ask about it.   No new adverse effects.  [facility] but unable to obtain them herself/for caretaker.  Did not do sputum cultures, not producing any sputum x 3-4 months.    Does note she has been having diarrhea daily since November 2019. Does not attribute it to her antimicrobials.  [facility] 2020 [facility] small [facility] in small bowel.     Review of Systems:   Const: no fever, chills, night sweats, or weight loss     Eyes: no change in vision  CV: no chest pain, edema, or palpitation      Resp: no SOB, cough, wheeze  GI: no abd pain, +diarrhea, constipation       GU: no dysuria  Msk: negative.          Psych: euthymic  Endocrine: no polyuria or polydipsia        Neuro: No headache or numbness    Medications:    Current Outpatient Medications   Medication Sig    ADVAIR HFA 115-21 mcg/actuation Inhaler Take 115 mcg by inhalation 2  times daily.    Amlodipine (NORVASC) 5 mg Tablet Take 5 mg by mouth every morning.    Atorvastatin (LIPITOR) 80 mg tablet Take 80 mg by mouth every day at bedtime.    Clopidogrel (PLAVIX) 75 mg Tablet Take 75 mg by mouth every morning.    ferrous sulfate 325 mg (65 mg iron) Tablet Take 325 mg by mouth 2 times daily.    Isosorbide Mononitrate (IMDUR) 30 mg SR Tablet Take 60 mg by mouth every morning.    Leflunomide (ARAVA) 10 mg Tablet Take 10 mg by mouth every day at bedtime.    Losartan (COZAAR) 100 mg tablet Take 50 mg by mouth every day at bedtime.    Metoprolol Succinate (TOPROL-XL) 100 mg XL tablet Take 100 mg by mouth every morning.    Omeprazole (PRILOSEC) 20 mg Delayed Release Capsule Take 20 mg by mouth every morning.    PredniSONE (DELTASONE) 5 mg Tablet Take 5 mg by mouth every day at bedtime.    Study Drug - Clofazimine (IRB [phone] 50 mg Capsule Take 2 capsules (100 mg) by mouth once daily at approximately the same time each day, WITH food. Save all bottles and extra capsules.    Study Drug - Clofazimine (IRB [phone] 50 mg Capsule Take 2  capsules (100 mg) by mouth once daily at approximately the same time each day, WITH food. Save all bottles and extra capsules.    Study Drug - Clofazimine (IRB [phone]) 50 mg Capsule Take 2 capsules (100 mg) by mouth once daily at approximately the same time each day, WITH food. Save all bottles and extra capsules.    Study Drug - Clofazimine (IRB [phone]) 50 mg Capsule Take 2 capsules (100 mg) by mouth once daily at approximately the same time each day, WITH food. Save all bottles and extra capsules.    SYNTHROID 125 mcg Tablet Take 125 mcg by mouth every morning.    ZETIA 10 mg Tablet Take 10 mg by mouth every morning.     No current facility-administered medications for this visit.        Allergies:  Allergies   Allergen Reactions    Lisinopril Unknown-Explain in Comments     NA    Norco [Hydrocodone-Acetaminophen] Unknown-Explain in Comments     na     Penicillin Unknown-Explain in Comments     na    Sulfa (Sulfonamide Antibiotics) Unknown-Explain in Comments     na       Labs:   Lab Results   Lab Name Value Date/Time    NA 137 11/20/2017 11:31 AM    K 4.2 11/20/2017 11:31 AM    CL 106 11/20/2017 11:31 AM    CO2 19 (L) 11/20/2017 11:31 AM    BUN 23 (H) 11/20/2017 11:31 AM    CR 1.16 11/20/2017 11:31 AM    GLU 99 11/20/2017 11:31 AM     Lab Results   Lab Name Value Date/Time    AST 28 11/20/2017 11:31 AM    ALT 22 11/20/2017 11:31 AM    ALP 62 11/20/2017 11:31 AM    ALB 3.7 11/20/2017 11:31 AM    TP 6.9 11/20/2017 11:31 AM    TBIL 1.1 11/20/2017 11:31 AM     Lab Results   Lab Name Value Date/Time    WBC 9.1 11/20/2017 11:31 AM    HCT 41.2 11/20/2017 11:31 AM    PLT 191 11/20/2017 11:31 AM     No results found for: TSH, FT4    IMAGING:     3/25 CT Chest with contrast:  IMPRESSION:  1. Residual bronchiectasis within the right middle and right lower  lobes and mild mucus plugging with mild peripheral areas of fibrosis more  so in the lower lobes, suggesting prior inflammation.    MICRO:   None on file       Assessment/Plan:      Donna Doyle is a 84yrold woman with past medical history of COPD, RA, and pulmonary MAC (diagnosed 2018), who has a telephone visit today for follow up.    # Pulmonary MAC  -Continue azithromycin 2582mMWF  -Continue rifabutin 15062mWF  -Continue clofazamine 100m48mily  -Repeat CT Chest with contrast  -[facility]  -Will plan to obtain induced sputum for AFB sputum culture to assess for sputum clearance and guiding duration of therapy. [facility] induced sputum [facility].     The patient was advised to follow up in 6 weeks.      Patient was seen and staffed with Dr. CrabChrist Kick  Infectious Diseases Fellow  Pager: [phone]         EDUCATION: I  educated/instructed the patient or caregiver regarding all aspects of the above stated plan of care.  The patient or caregiver indicated understanding. [place] interpreter was not used.    Thank you for the opportunity to participate in the care of this patient.  Should you have further questions, please do not hesitate to contact our clinic at [phone].        ID Attending Addendum:    I saw and evaluated the patient with Dr. Reece Levy.  I repeated and reviewed pertinent history and physical examination.  Together we developed the assessment and management plan above.  I agree with her findings as stated above. Patient reports overall doing well though also overall not substantially improved over the past year. Pt's daughter-in-law, however, disagrees and states her cough is much improved since starting treatment. Only new issues is the chronic diarrhea which occurs 3-5x daily and is soft to liquid. She reports whenever she pees she usually poops a little too. A/w some abdominal discomfort which she has been told is due to her previously identified ulcer. This has been stable for some time. Pt and her DIL also note that [facility] is not seeing any outpatients and that it is very unlikely they would be willing to do sputum testing there. Will therefore try to arrange here to obtain additional micro data. More importantly she is in need of f/u imaging. Given her questionable clinical improvement and possible drug s/e if her imaging is not any better will likely trial a time off abxs to see if her diarrhea improves. If it is markedly improve will need to determine an end date either by micro or by other means.     [name], MD  Assistant Professor   Division of Infectious Disease  (p): [phone]  (o): [phone]

## 2019-01-03 ENCOUNTER — Telehealth: Payer: Self-pay | Admitting: Internal Medicine

## 2019-01-03 NOTE — Addendum Note (Signed)
Addended byMaude Leriche on: 01/03/2019 10:22 AM     Modules accepted: Level of Service

## 2019-01-03 NOTE — Telephone Encounter (Signed)
Records request was completed by MD and faxed to [facility] on 01/03/19. Thank You Renetta Chalk MA

## 2019-01-09 ENCOUNTER — Telehealth: Payer: Self-pay | Admitting: Internal Medicine

## 2019-01-09 DIAGNOSIS — A310 Pulmonary mycobacterial infection: Secondary | ICD-10-CM

## 2019-01-09 NOTE — Telephone Encounter (Signed)
Called patient's number on file and son answered. Son was given a message for the patient call the clinic to relay the information below. Patient's son was given the clinic phone number.     Thank you,  Luan Moore  [facility] II  Internal Medicine & Specialty Clinics

## 2019-01-14 ENCOUNTER — Other Ambulatory Visit: Payer: Self-pay

## 2019-01-14 ENCOUNTER — Telehealth: Payer: Self-pay | Admitting: Internal Medicine

## 2019-01-14 NOTE — Telephone Encounter (Signed)
Received a call from patients daughter in law Misty Stanley requesting lab slips to be faxed to [facility].   Phone number [phone]  Fax number [phone].    Called [facility]  And verified fax number. Lab slips faxed and Misty Stanley was informed.     Thanks,  [employer]

## 2019-01-16 ENCOUNTER — Ambulatory Visit
Admission: RE | Admit: 2019-01-16 | Discharge: 2019-01-16 | Disposition: A | Discharge status: Home / Self Care | Payer: Medicare Other | Source: Ambulatory Visit | Attending: Internal Medicine | Admitting: Internal Medicine

## 2019-01-16 ENCOUNTER — Other Ambulatory Visit: Payer: Self-pay

## 2019-01-16 DIAGNOSIS — A310 Pulmonary mycobacterial infection: Secondary | ICD-10-CM | POA: Insufficient documentation

## 2019-01-16 DIAGNOSIS — R911 Solitary pulmonary nodule: Secondary | ICD-10-CM

## 2019-01-16 MED ORDER — IOHEXOL 350 MG IODINE/ML INTRAVENOUS SOLUTION
64.0000 mL | INTRAVENOUS | Status: AC
Start: 2019-01-16 — End: 2019-01-16
  Administered 2019-01-16: 17:00:00 64 mL via INTRAVENOUS

## 2019-01-18 ENCOUNTER — Other Ambulatory Visit: Payer: Self-pay

## 2019-01-18 MED ORDER — STUDY DRUG - CLOFAZIMINE 50 MG CAPSULE  - CLOFAZIMINE (963340)
0 refills | Status: DC
Start: 2019-01-18 — End: 2019-04-29
  Filled 2019-01-18: qty 200, 100d supply, fill #0

## 2019-03-12 ENCOUNTER — Telehealth: Payer: Self-pay | Admitting: Internal Medicine

## 2019-03-12 NOTE — Telephone Encounter (Signed)
Patient's caregiver/ daughter in law Lattie Haw called needing to cancel telephone appointment scheduled for 03/14/19 because patient will be in dialysis. She asked if it is possible to have telephone appointment tomorrow instead. She will try to make herself available at number [phone]. Please advise, thank you.    [place] Coordinator II   Internal Medicine & Specialty Clinics

## 2019-03-13 NOTE — Telephone Encounter (Signed)
Please schedule as requested by Dr. Stan Head.     I can do a telephone visit this afternoon after 2:00 if that works for them. Otherwise we will have to schedule something for the week of 7/13 - 7/18.     Thanks,   Geophysicist/field seismologist comment      This may have to be an overbook. Thank you. Lina Sar, RN

## 2019-03-14 ENCOUNTER — Ambulatory Visit: Payer: Medicare Other | Admitting: Internal Medicine

## 2019-04-29 ENCOUNTER — Other Ambulatory Visit: Payer: Self-pay

## 2019-04-29 ENCOUNTER — Other Ambulatory Visit: Payer: Self-pay | Admitting: Internal Medicine

## 2019-04-29 NOTE — Progress Notes (Signed)
Patient has completed 1.5+ yrs of clofazimine + rifabutin + azithromycin for MAC infection. Sxs have improved and repeat CT show sustained improvement. We have been unable to confirm microbiological clearance, but at this point there is likely limited improvement from further treatment. Will d/c her abxs at this time and have her return to clinic in 3 mos to ensure no e/o early relapse.

## 2019-05-22 ENCOUNTER — Telehealth: Payer: Self-pay | Admitting: Internal Medicine

## 2019-05-22 NOTE — Telephone Encounter (Signed)
Caller Name: Kindall Swaby    Reason for call: Deceased patient    Call back number: [phone] (home)      Message to relay:     Patient's daughter in law called in to notify Dr. Stan Head of patient's death. Mentioned that she passed away on 26-May-2019. She made it a point to mention that Dr. Stan Head was always so good to this patient and sends her gratitude.     Kendra Opitz, [place]  [facility] Internal Medicine and Specialty  [phone] (Phone)  [phone] (Fax)

## 2019-06-13 DEATH — deceased
